# Patient Record
Sex: Female | Born: 1965 | Race: White | Hispanic: No | Marital: Married | Smoking: Never smoker
Health system: Southern US, Community
[De-identification: ages and names within clinical notes are randomized; demographics above are authoritative.]

## PROBLEM LIST (undated history)

## (undated) DIAGNOSIS — N946 Dysmenorrhea, unspecified: Secondary | ICD-10-CM

## (undated) DIAGNOSIS — I8390 Asymptomatic varicose veins of unspecified lower extremity: Secondary | ICD-10-CM

## (undated) DIAGNOSIS — N393 Stress incontinence (female) (male): Secondary | ICD-10-CM

## (undated) HISTORY — DX: Dysmenorrhea, unspecified: N94.6

## (undated) HISTORY — PX: VARICOSE VEIN SURGERY: SHX832

## (undated) HISTORY — DX: Stress incontinence (female) (male): N39.3

## (undated) HISTORY — DX: Asymptomatic varicose veins of unspecified lower extremity: I83.90

## (undated) HISTORY — PX: LIGAMENT REPAIR: SHX5444

## (undated) HISTORY — PX: OTHER SURGICAL HISTORY: SHX169

## (undated) HISTORY — PX: WISDOM TOOTH EXTRACTION: SHX21

## (undated) HISTORY — PX: TONSILLECTOMY: SUR1361

---

## 1998-08-19 ENCOUNTER — Other Ambulatory Visit: Admission: RE | Admit: 1998-08-19 | Discharge: 1998-08-19 | Payer: Self-pay | Admitting: Obstetrics and Gynecology

## 2000-03-25 ENCOUNTER — Inpatient Hospital Stay (HOSPITAL_COMMUNITY): Admission: AD | Admit: 2000-03-25 | Discharge: 2000-03-27 | Payer: Self-pay | Admitting: Obstetrics and Gynecology

## 2001-11-08 ENCOUNTER — Ambulatory Visit (HOSPITAL_COMMUNITY): Admission: RE | Admit: 2001-11-08 | Discharge: 2001-11-08 | Payer: Self-pay | Admitting: Family Medicine

## 2001-11-08 ENCOUNTER — Encounter: Payer: Self-pay | Admitting: Family Medicine

## 2001-11-11 ENCOUNTER — Inpatient Hospital Stay (HOSPITAL_COMMUNITY): Admission: EM | Admit: 2001-11-11 | Discharge: 2001-11-19 | Payer: Self-pay | Admitting: Emergency Medicine

## 2001-11-11 ENCOUNTER — Encounter: Payer: Self-pay | Admitting: Emergency Medicine

## 2001-11-13 ENCOUNTER — Encounter: Payer: Self-pay | Admitting: Pulmonary Disease

## 2001-11-16 ENCOUNTER — Encounter: Payer: Self-pay | Admitting: Orthopedic Surgery

## 2002-06-20 ENCOUNTER — Other Ambulatory Visit: Admission: RE | Admit: 2002-06-20 | Discharge: 2002-06-20 | Payer: Self-pay | Admitting: Obstetrics and Gynecology

## 2002-12-18 ENCOUNTER — Ambulatory Visit (HOSPITAL_BASED_OUTPATIENT_CLINIC_OR_DEPARTMENT_OTHER): Admission: RE | Admit: 2002-12-18 | Discharge: 2002-12-18 | Payer: Self-pay | Admitting: Orthopedic Surgery

## 2003-07-03 ENCOUNTER — Other Ambulatory Visit: Admission: RE | Admit: 2003-07-03 | Discharge: 2003-07-03 | Payer: Self-pay | Admitting: Obstetrics and Gynecology

## 2004-07-03 ENCOUNTER — Other Ambulatory Visit: Admission: RE | Admit: 2004-07-03 | Discharge: 2004-07-03 | Payer: Self-pay | Admitting: Obstetrics and Gynecology

## 2005-07-07 ENCOUNTER — Other Ambulatory Visit: Admission: RE | Admit: 2005-07-07 | Discharge: 2005-07-07 | Payer: Self-pay | Admitting: Obstetrics and Gynecology

## 2014-04-16 ENCOUNTER — Other Ambulatory Visit: Payer: Self-pay | Admitting: Occupational Medicine

## 2014-04-16 ENCOUNTER — Ambulatory Visit: Payer: Self-pay

## 2014-04-16 DIAGNOSIS — M79641 Pain in right hand: Secondary | ICD-10-CM

## 2014-11-06 ENCOUNTER — Ambulatory Visit: Payer: Self-pay | Admitting: Podiatry

## 2014-11-27 ENCOUNTER — Encounter: Payer: Self-pay | Admitting: Podiatry

## 2014-11-27 ENCOUNTER — Ambulatory Visit (INDEPENDENT_AMBULATORY_CARE_PROVIDER_SITE_OTHER): Payer: BC Managed Care – PPO

## 2014-11-27 ENCOUNTER — Ambulatory Visit (INDEPENDENT_AMBULATORY_CARE_PROVIDER_SITE_OTHER): Payer: BC Managed Care – PPO | Admitting: Podiatry

## 2014-11-27 VITALS — BP 120/70 | HR 62 | Resp 12 | Ht 68.0 in | Wt 170.0 lb

## 2014-11-27 DIAGNOSIS — M2010 Hallux valgus (acquired), unspecified foot: Secondary | ICD-10-CM

## 2014-11-27 NOTE — Patient Instructions (Signed)
Bunion You have a bunion deformity of the feet. This is more common in women. It tends to be an inherited problem. Symptoms can include pain, swelling, and deformity around the great toe. Numbness and tingling may also be present. Your symptoms are often worsened by wearing shoes that cause pressure on the bunion. Changing the type of shoes you wear helps reduce symptoms. A wide shoe decreases pressure on the bunion. An arch support may be used if you have flat feet. Avoid shoes with heels higher than two inches. This puts more pressure on the bunion. X-rays may be helpful in evaluating the severity of the problem. Other foot problems often seen with bunions include corns, calluses, and hammer toes. If the deformity or pain is severe, surgical treatment may be necessary. Keep off your painful foot as much as possible until the pain is relieved. Call your caregiver if your symptoms are worse.  SEEK IMMEDIATE MEDICAL CARE IF:  You have increased redness, pain, swelling, or other symptoms of infection. Document Released: 12/13/2005 Document Revised: 03/06/2012 Document Reviewed: 06/12/2007 Exit[employer] Patient Information 2015 [employer]. This information is not intended to replace advice given to you by your health care provider. Make sure you discuss any questions you have with your health care provider.  

## 2014-11-27 NOTE — Progress Notes (Signed)
   Subjective:    Patient ID: Jeanette Parker, female    DOB: 03-07-1966, 48 y.o.   MRN: [id number]  HPI 48 year old female returns the office today for complaints of bilateral bunions. She states that she's had bunions for several years and she has some discomfort while running in with shoe gear. She states that they ache. She does state when she runs she gets inflammation overlying the prominences. She has been placing pads over the bunions in her shoes to help alleviate some of the symptoms. No other complaints at this time.   Review of Systems  All other systems reviewed and are negative.      Objective:   Physical Exam AAO x3, NAD DP/PT pulses palpable bilaterally, CRT less than 3 seconds Protective sensation intact with Simms Weinstein monofilament, vibratory sensation intact, Achilles tendon reflex intact Moderate bilateral structural HAV deformity. There is no pain or crepitation with first MTPJ range of motion. There is no significant hypermobility. There is prominence over the medial aspect of the foot along the first metatarsal head with lateral deviation of the hallux. There is no overlapping of the digits. MMT 5/5, ROM WNL No pain with calf compression, swelling, warmth, erythema. No open lesions or pre-ulcerative lesions.        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 -X-rays were obtained and reviewed with the patient. -Both conservative versus surgical options were discussed including alternatives, risks, complications. Etiology discussed as well. -At this time the patient states she is considering surgical intervention however she'll likely wait until next year. At this time she would likely need [facility] bunionectomy with possible [facility]. I discussed with her the incision placement as well as the procedure and the postoperative course. -For now we'll continue with conservative treatment. Discussed with her various padding options to  help protect the bunion from rubbing shoe gear as well as proper shoe gear. -Follow-up as needed. In the meantime, call the office with any questions, concerns, change in symptoms.

## 2014-11-28 DIAGNOSIS — M21619 Bunion of unspecified foot: Secondary | ICD-10-CM | POA: Insufficient documentation

## 2015-08-13 ENCOUNTER — Other Ambulatory Visit: Payer: Self-pay | Admitting: Orthopedic Surgery

## 2015-08-13 DIAGNOSIS — M5441 Lumbago with sciatica, right side: Secondary | ICD-10-CM

## 2015-08-21 ENCOUNTER — Other Ambulatory Visit: Payer: Self-pay | Admitting: Orthopedic Surgery

## 2015-08-21 ENCOUNTER — Ambulatory Visit
Admission: RE | Admit: 2015-08-21 | Discharge: 2015-08-21 | Disposition: A | Discharge status: Home / Self Care | Payer: BC Managed Care – PPO | Source: Ambulatory Visit | Attending: Orthopedic Surgery | Admitting: Orthopedic Surgery

## 2015-08-21 VITALS — BP 123/76 | HR 67

## 2015-08-21 DIAGNOSIS — M5459 Other low back pain: Secondary | ICD-10-CM

## 2015-08-21 DIAGNOSIS — M5441 Lumbago with sciatica, right side: Secondary | ICD-10-CM

## 2015-08-21 DIAGNOSIS — M545 Low back pain: Secondary | ICD-10-CM

## 2015-08-21 MED ORDER — METHYLPREDNISOLONE ACETATE 40 MG/ML INJ SUSP (RADIOLOG
120.0000 mg | Freq: Once | INTRAMUSCULAR | Status: AC
  Administered 2015-08-21: 120 mg via INTRA_ARTICULAR

## 2015-08-21 MED ORDER — IOHEXOL 180 MG/ML  SOLN
1.0000 mL | Freq: Once | INTRAMUSCULAR | Status: DC | PRN
  Administered 2015-08-21: 1 mL via INTRA_ARTICULAR

## 2015-08-21 NOTE — Discharge Instructions (Signed)

## 2016-05-13 ENCOUNTER — Ambulatory Visit (INDEPENDENT_AMBULATORY_CARE_PROVIDER_SITE_OTHER): Payer: BC Managed Care – PPO | Admitting: Podiatry

## 2016-05-13 ENCOUNTER — Encounter: Payer: Self-pay | Admitting: Podiatry

## 2016-05-13 ENCOUNTER — Ambulatory Visit (INDEPENDENT_AMBULATORY_CARE_PROVIDER_SITE_OTHER): Payer: BC Managed Care – PPO

## 2016-05-13 VITALS — BP 118/91 | HR 63 | Resp 16

## 2016-05-13 DIAGNOSIS — M2010 Hallux valgus (acquired), unspecified foot: Secondary | ICD-10-CM

## 2016-05-13 DIAGNOSIS — M722 Plantar fascial fibromatosis: Secondary | ICD-10-CM | POA: Diagnosis not present

## 2016-05-13 MED ORDER — DICLOFENAC SODIUM 75 MG PO TBEC: 75.0000 mg | DELAYED_RELEASE_TABLET | Freq: Two times a day (BID) | ORAL | Status: DC

## 2016-05-13 MED ORDER — TRIAMCINOLONE ACETONIDE 10 MG/ML IJ SUSP
10.0000 mg | Freq: Once | INTRAMUSCULAR | Status: AC
  Administered 2016-05-13: 10 mg

## 2016-05-13 NOTE — Progress Notes (Signed)
Subjective:     Patient ID: Jeanette BockWanda H Parker, female   DOB: 1966/05/22, 50 y.o.   MRN: [id number]  HPI patient states she's getting a lot of pain in her left heel and also she has structural bunion deformities which gradually become more aggravating but still do not cause significant discomfort   Review of Systems  All other systems reviewed and are negative.      Objective:   Physical Exam  Constitutional: She is oriented to person, place, and time.  Cardiovascular: Intact distal pulses.   Musculoskeletal: Normal range of motion.  Neurological: She is oriented to person, place, and time.  Skin: Skin is warm.  Nursing note and vitals reviewed.  neurovascular status intact muscle strength adequate range of motion within normal limits with patient found to have exquisite discomfort plantar aspect left heel at the insertional point tendon into the calcaneus with hyperostosis of the medial aspect first metatarsal head bilateral. Patient is mild deviation the hallux against second toe mild depression of the arch with no equinus and I did note good digital perfusion and patient to be well oriented 3     Assessment:     Acute plantar fasciitis left with chronic bunion structural deformity bilateral with family history    Plan:     H&P and x-rays reviewed with patient. Today I injected the left plantar fascia 3 mg Kenalog 5 mg Xylocaine and applied fascial brace with instructions on usage. Gave instructions on physical therapy anti-inflammatory and placed on diclofenac 75 mg twice a day  X-ray report indicate elevation of the 1 to intermetatarsal angle with deviation of the hallux against the second toe

## 2016-05-13 NOTE — Patient Instructions (Signed)

## 2016-05-25 ENCOUNTER — Telehealth: Payer: Self-pay | Admitting: *Deleted

## 2016-05-25 NOTE — Telephone Encounter (Signed)
Pt called to see if she could get a shot at the next appt.  I reviewed pt's clinicals and she may be able to get an injection on 05/27/2016 after discussing with Dr. Charlsie Merlesegal.  Left message informing pt.

## 2016-05-27 ENCOUNTER — Ambulatory Visit: Payer: BC Managed Care – PPO | Admitting: Podiatry

## 2016-05-27 ENCOUNTER — Encounter: Payer: Self-pay | Admitting: Podiatry

## 2016-05-27 ENCOUNTER — Ambulatory Visit (INDEPENDENT_AMBULATORY_CARE_PROVIDER_SITE_OTHER): Payer: BC Managed Care – PPO | Admitting: Podiatry

## 2016-05-27 DIAGNOSIS — M722 Plantar fascial fibromatosis: Secondary | ICD-10-CM | POA: Diagnosis not present

## 2016-05-27 MED ORDER — TRIAMCINOLONE ACETONIDE 10 MG/ML IJ SUSP
10.0000 mg | Freq: Once | INTRAMUSCULAR | Status: AC
  Administered 2016-05-27: 10 mg

## 2016-05-28 NOTE — Progress Notes (Signed)
Subjective:     Patient ID: Jeanette BockWanda H Dean, female   DOB: [birth year], 50 y.o.   MRN: [id number]  HPI patient states her left heel has started to hurt her again and she no she is getting need something for the long-term to try to provide support   Review of Systems     Objective:   Physical Exam Neurovascular status intact muscle strength adequate range of motion within normal limits with patient left plantar heel still tender with moderate depression of the arch noted upon weightbearing    Assessment:     Acute plantar fasciitis left with inflammation around the medial band    Plan:     Reinjected the plantar fascial left 3 mg Kenalog 5 mg Xylocaine and instructed on orthotics and was scanned for custom orthotic devices today. Reappoint when returned

## 2016-06-22 ENCOUNTER — Ambulatory Visit: Payer: BC Managed Care – PPO | Admitting: *Deleted

## 2016-06-22 DIAGNOSIS — M722 Plantar fascial fibromatosis: Secondary | ICD-10-CM

## 2016-06-22 NOTE — Patient Instructions (Signed)

## 2016-06-23 NOTE — Progress Notes (Signed)
Patient ID: Jeanette BockWanda H Dean, female   DOB: 29-Aug-1966, 50 y.o.   MRN: [id number] Patient presents for orthotic pick up.  Verbal and written break in and wear instructions given.  Patient will follow up in 4 weeks if symptoms worsen or fail to improve.

## 2016-07-02 ENCOUNTER — Telehealth: Payer: Self-pay

## 2016-07-02 NOTE — Telephone Encounter (Signed)
LVM regarding pt message about toe numbness post injection therapy. Advised her that this was normal, bruised nerve from the injection could be a cause of this and that it should resolve on its own in a few weeks.

## 2016-08-12 ENCOUNTER — Other Ambulatory Visit: Payer: Self-pay | Admitting: Podiatry

## 2016-08-12 NOTE — Telephone Encounter (Signed)
Pt needs an appt prior to future refills. 

## 2018-03-24 DIAGNOSIS — M545 Low back pain, unspecified: Secondary | ICD-10-CM | POA: Insufficient documentation

## 2019-06-22 ENCOUNTER — Ambulatory Visit: Payer: BC Managed Care – PPO | Admitting: Podiatry

## 2019-06-22 ENCOUNTER — Other Ambulatory Visit: Payer: Self-pay

## 2019-06-22 ENCOUNTER — Encounter: Payer: Self-pay | Admitting: Podiatry

## 2019-06-22 ENCOUNTER — Ambulatory Visit (INDEPENDENT_AMBULATORY_CARE_PROVIDER_SITE_OTHER): Payer: BC Managed Care – PPO

## 2019-06-22 VITALS — Temp 98.1°F

## 2019-06-22 DIAGNOSIS — M722 Plantar fascial fibromatosis: Secondary | ICD-10-CM

## 2019-06-22 MED ORDER — DICLOFENAC SODIUM 75 MG PO TBEC: 75.0000 mg | DELAYED_RELEASE_TABLET | Freq: Two times a day (BID) | ORAL | 2 refills | Status: DC

## 2019-06-22 NOTE — Progress Notes (Signed)
Subjective:   Patient ID: Jeanette Parker, female   DOB: 53 y.o.   MRN: [id number]   HPI Patient presents stating that the right heel has been bothering her that the left is continuing to grow and she knows she needs new orthotics as it started to wear out.  Patient does not smoke likes to be active but states is been hurting for around 6 months   Review of Systems  All other systems reviewed and are negative.       Objective:  Physical Exam Vitals signs and nursing note reviewed.  Constitutional:      Appearance: She is well-developed.  Pulmonary:     Effort: Pulmonary effort is normal.  Musculoskeletal: Normal range of motion.  Skin:    General: Skin is warm.  Neurological:     Mental Status: She is alert.     Neurovascular status intact muscle strength adequate range of motion was within normal limits with patient noted to have exquisite discomfort plantar aspect heel right with inflammation fluid around the medial band     Assessment:  Acute plantar fasciitis right with inflammation fluid buildup     Plan:  H&P discussed condition and sterile prep injected the fascia 3 mg Kenalog 5 mg Xylocaine and applied fascial brace right foot with instructions on stretching exercises physical therapy.  Patient will be seen back to recheck again in the next several weeks or earlier if needed and is casted for functional orthotics today  X-ray indicates small spur formation bilateral with no indication of stress fracture arthritis

## 2019-06-22 NOTE — Patient Instructions (Signed)

## 2019-07-12 ENCOUNTER — Other Ambulatory Visit: Payer: Self-pay

## 2019-07-12 ENCOUNTER — Ambulatory Visit: Payer: BC Managed Care – PPO | Admitting: Podiatry

## 2019-07-12 ENCOUNTER — Ambulatory Visit: Payer: BC Managed Care – PPO | Admitting: Orthotics

## 2019-07-12 ENCOUNTER — Encounter: Payer: Self-pay | Admitting: Podiatry

## 2019-07-12 VITALS — Temp 97.6°F

## 2019-07-12 DIAGNOSIS — M545 Low back pain, unspecified: Secondary | ICD-10-CM

## 2019-07-12 DIAGNOSIS — M722 Plantar fascial fibromatosis: Secondary | ICD-10-CM

## 2019-07-12 NOTE — Progress Notes (Signed)
Subjective:   Patient ID: Jeanette Parker, female   DOB: 53 y.o.   MRN: [id number]   HPI Patient states that her heel is still bothering her and it seems to be more on the outside and she admits that she walked on the beach and was quite aggressive with that and knows that also that she needs new orthotics   ROS      Objective:  Physical Exam  Neurovascular status intact with patient found to have discomfort in the lateral side of the fascia with inflammation fluid buildup     Assessment:  Plantar fasciitis improved but present on the lateral side of the fascial band     Plan:  Reviewed condition and did a sterile prep and injected the lateral plantar fashion into the center of the area 3 mg Kenalog 5 mg Xylocaine and orthotics were dispensed with instructions on usage

## 2019-07-12 NOTE — Progress Notes (Signed)

## 2019-07-12 NOTE — Patient Instructions (Signed)

## 2019-08-23 ENCOUNTER — Other Ambulatory Visit: Payer: Self-pay | Admitting: Obstetrics and Gynecology

## 2019-08-23 DIAGNOSIS — R928 Other abnormal and inconclusive findings on diagnostic imaging of breast: Secondary | ICD-10-CM

## 2019-08-28 ENCOUNTER — Other Ambulatory Visit: Payer: Self-pay

## 2019-08-28 ENCOUNTER — Ambulatory Visit
Admission: RE | Admit: 2019-08-28 | Discharge: 2019-08-28 | Disposition: A | Discharge status: Home / Self Care | Payer: BC Managed Care – PPO | Source: Ambulatory Visit | Attending: Obstetrics and Gynecology | Admitting: Obstetrics and Gynecology

## 2019-08-28 DIAGNOSIS — R928 Other abnormal and inconclusive findings on diagnostic imaging of breast: Secondary | ICD-10-CM

## 2019-08-29 ENCOUNTER — Encounter: Payer: Self-pay | Admitting: Podiatry

## 2019-08-29 ENCOUNTER — Ambulatory Visit: Payer: BC Managed Care – PPO | Admitting: Podiatry

## 2019-08-29 DIAGNOSIS — M722 Plantar fascial fibromatosis: Secondary | ICD-10-CM

## 2019-08-30 NOTE — Progress Notes (Signed)
Subjective:   Patient ID: Jeanette Parker, female   DOB: 53 y.o.   MRN: [id number]   HPI Patient states her heel has been very sore and making it difficult to walk.  States that the injection only helped temporarily and the pain seems to be worsening and making it difficult for her to be ambulatory.  Patient points to plantar aspect right heel   ROS      Objective:  Physical Exam  Neurovascular status intact with patient found to have intense discomfort medial central and mild lateral band of the right plantar fascia at its insertion into the calcaneus with fluid buildup noted     Assessment:  Acute plantar fasciitis right which so far has not responded to conservative treatment     Plan:  Reviewed conditions at great length discussed different treatment options including complete immobilization versus immobilization with shockwave versus surgery and went over the pros and cons of all of these.  At this time we will get a try complete immobilization see response to this

## 2019-09-26 ENCOUNTER — Ambulatory Visit: Payer: BC Managed Care – PPO | Admitting: Podiatry

## 2019-10-23 ENCOUNTER — Telehealth: Payer: Self-pay | Admitting: Podiatry

## 2019-10-23 MED ORDER — DICLOFENAC SODIUM 75 MG PO TBEC: 75.0000 mg | DELAYED_RELEASE_TABLET | Freq: Two times a day (BID) | ORAL | 0 refills | Status: AC

## 2019-10-23 NOTE — Addendum Note (Signed)
Addended by: Harriett Sine D on: 10/23/2019 11:46 AM   Modules accepted: Orders

## 2019-10-23 NOTE — Telephone Encounter (Signed)
Pt is wanting a refill on diclofenac (VOLTAREN)

## 2019-10-29 IMAGING — US US BREAST*R* LIMITED INC AXILLA
1 series · 13 of 13 positions shown · non-contrast
Comparison: Previous exam(s).

CLINICAL DATA: Possible mass in the inferior retroareolar right
breast recent screening.

EXAM:
ULTRASOUND OF THE RIGHT BREAST

[Series 1: us breast*right* limited inc axilla · 0.06mm/px · 13 of 13 slices shown]
[im 1/13]
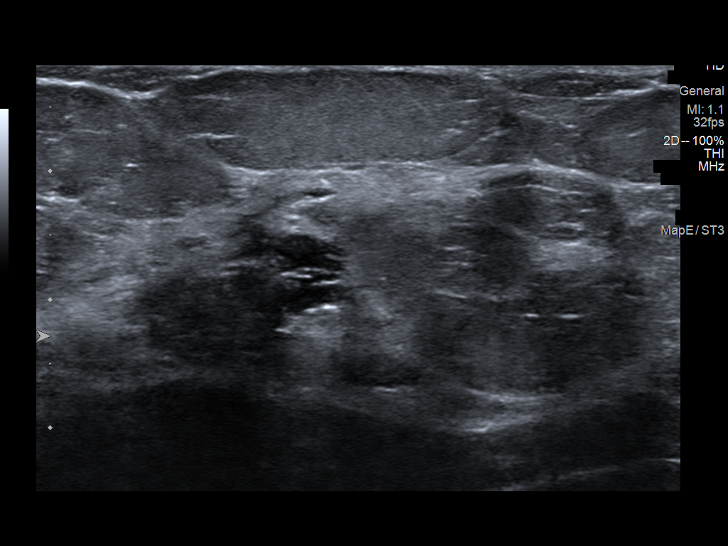
[im 2/13]
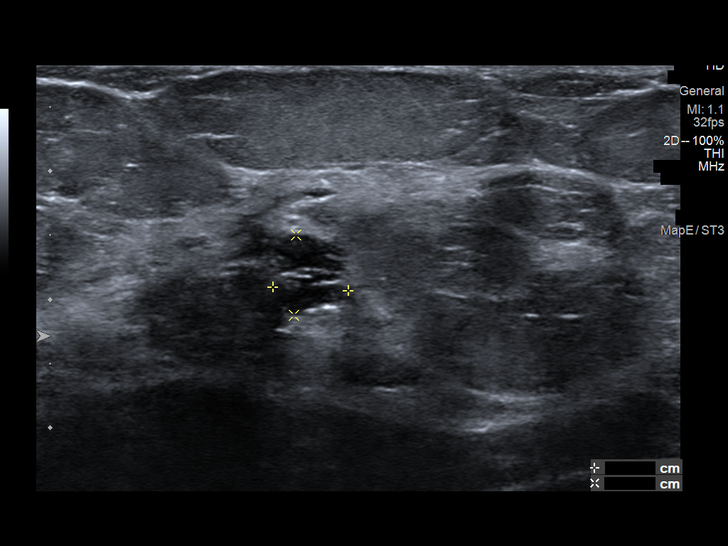
[im 3/13]
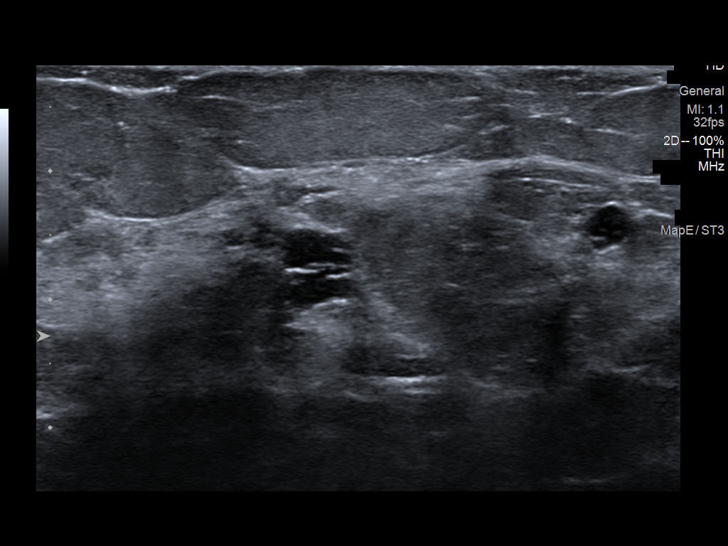
[im 4/13]
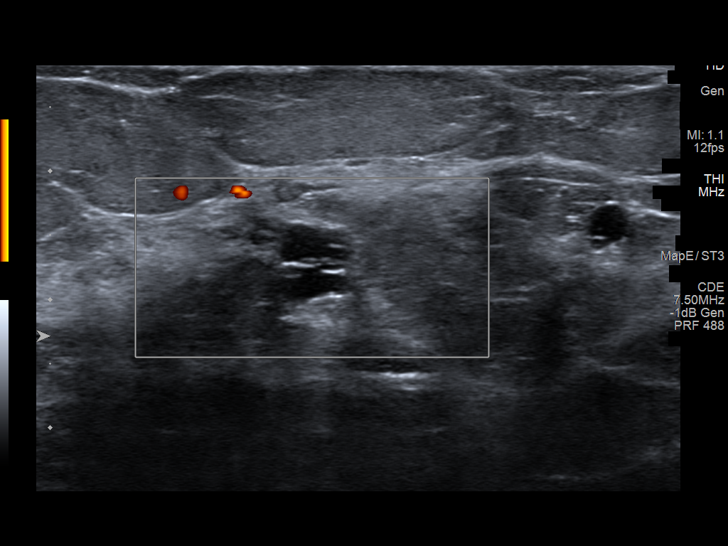
[im 5/13]
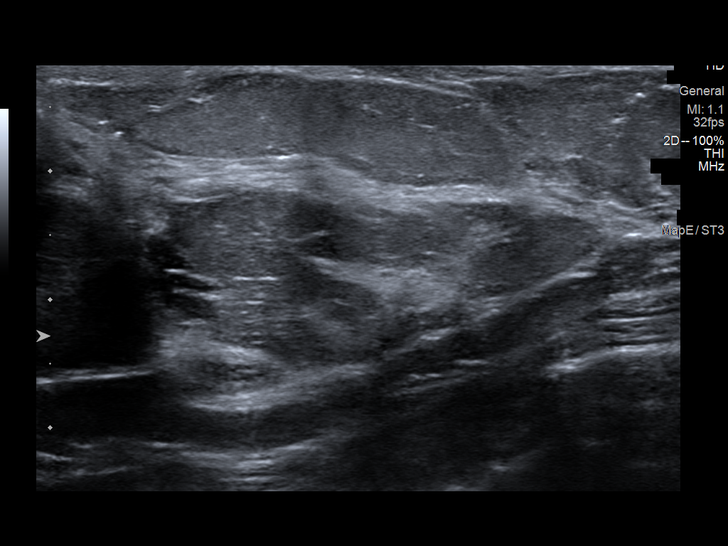
[im 6/13]
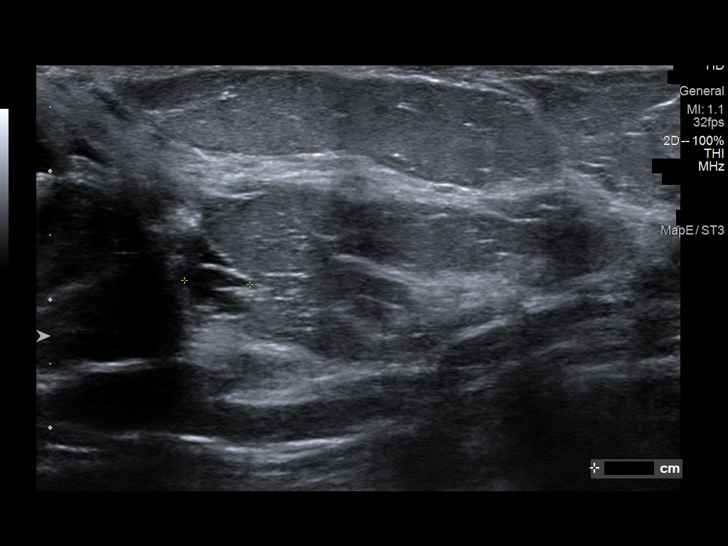
[im 7/13]
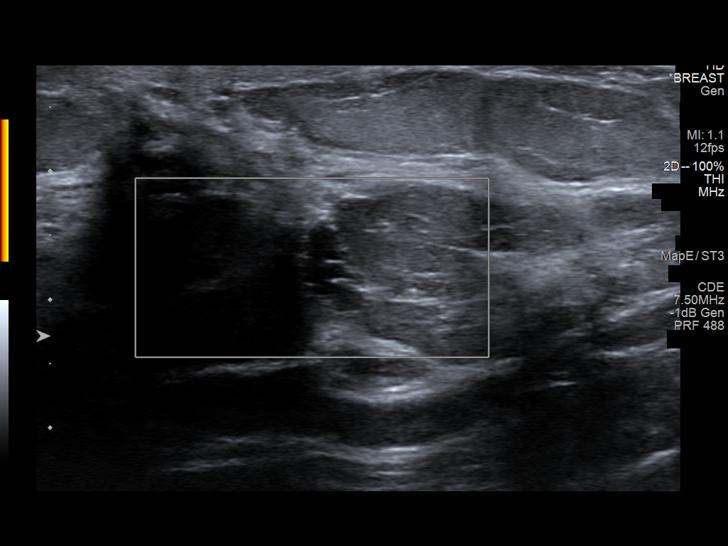
[im 8/13]
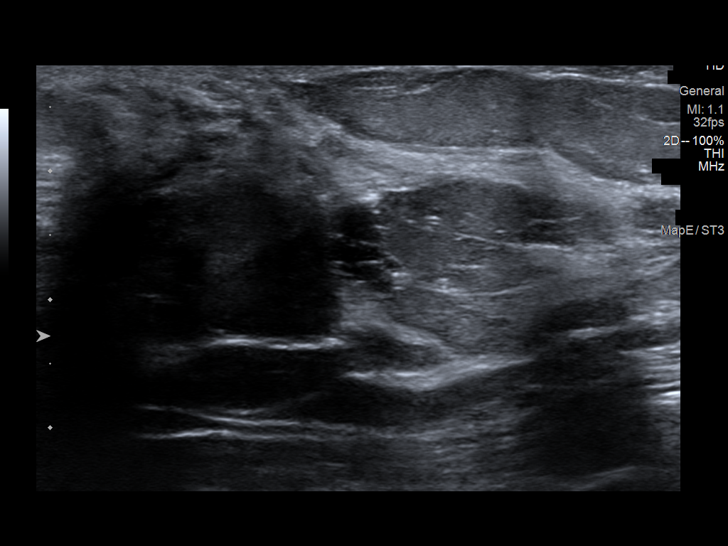
[im 9/13]
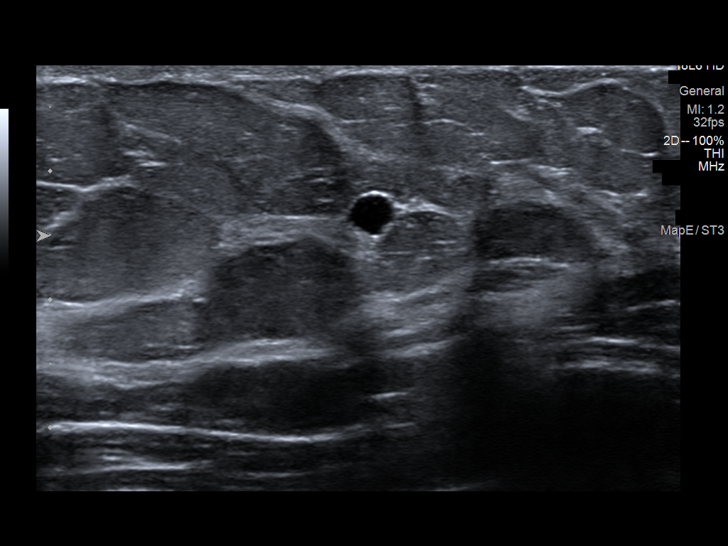
[im 10/13]
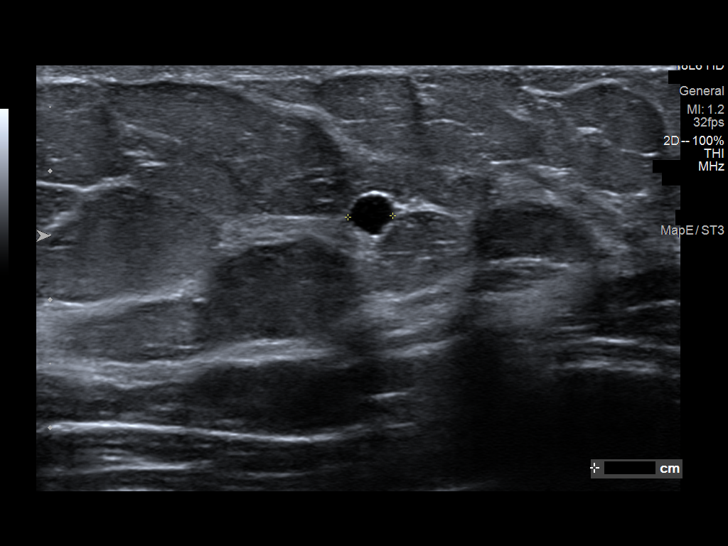
[im 11/13]
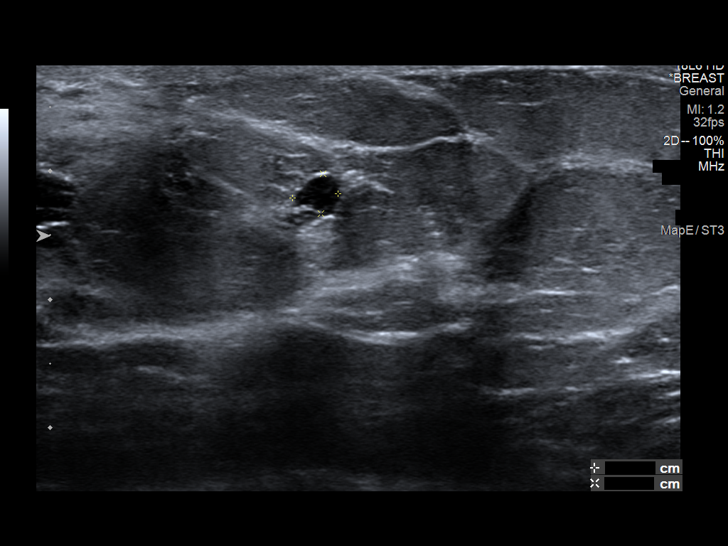
[im 12/13]
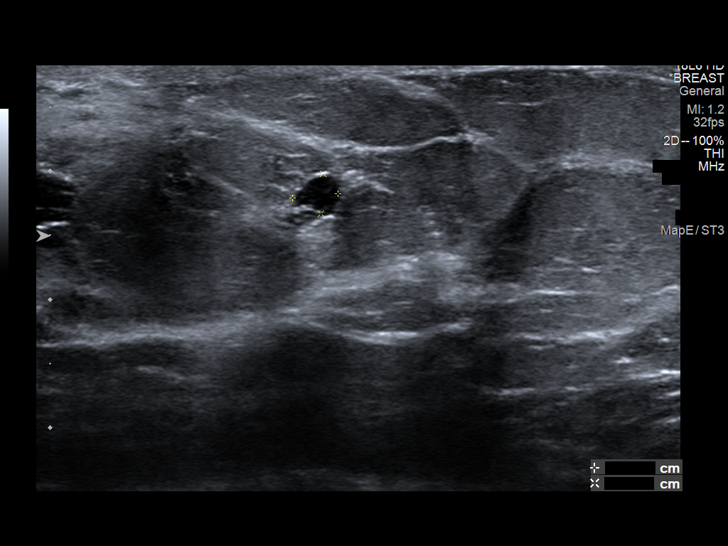
[im 13/13]
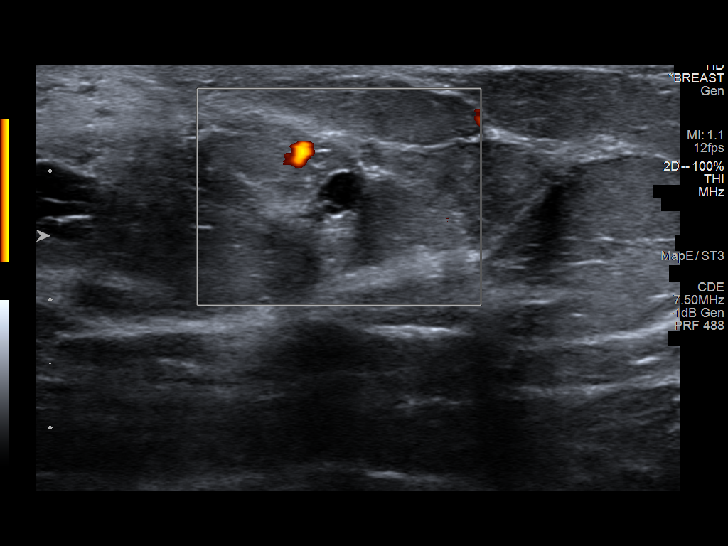

[13 of 13 positions shown; findings below may reference images not displayed]

FINDINGS: Targeted ultrasound is performed, showing a 6 mm cyst in the 6
o'clock retroareolar right breast, containing thin internal
septations. Corresponds to the mammographic mass. There is a nearby
smaller, similar-appearing cyst. No internal blood flow was seen
with power Doppler.
IMPRESSION: Small, benign right breast cysts.  No evidence of malignancy.

RECOMMENDATION:
Bilateral screening mammogram in year.

I have discussed the findings and recommendations with the patient.
Results were also provided in writing at the conclusion of the
visit. If applicable, a reminder letter will be sent to the patient
regarding the next appointment.

BI-RADS CATEGORY  2: Benign.

## 2019-12-18 ENCOUNTER — Telehealth: Payer: Self-pay | Admitting: *Deleted

## 2019-12-18 NOTE — Telephone Encounter (Signed)
Pt called states she has been in the boot for while and has had no improvement and would like to know her next options.

## 2019-12-18 NOTE — Telephone Encounter (Signed)
I spoke with pt and informed that options could be injections, change of medication, custom molded orthotics, but we had not seen her since 08/2019 and would benefit from an appt. Pt states she would like to know if Dr. Paulla Dolly would want a MRI.

## 2019-12-19 NOTE — Telephone Encounter (Signed)
Dr. Paulla Dolly states he would like to see pt before ordering advanced imaging or other orders. I informed pt Dr. Paulla Dolly wanted to re-evaluate prior to future orders. Transferred pt to schedulers.

## 2019-12-19 NOTE — Telephone Encounter (Signed)
Called patient back and left voicemail for patient to call and schedule appt

## 2019-12-19 NOTE — Telephone Encounter (Signed)
Called patient to have her come in for follow up appt per message from [place]. Patient answered the phone but did not speak and immediately hung up. Unable to leave a voicemail

## 2025-02-26 ENCOUNTER — Institutional Professional Consult (permissible substitution) (INDEPENDENT_AMBULATORY_CARE_PROVIDER_SITE_OTHER): Payer: Self-pay | Admitting: Otolaryngology
